# Patient Record
Sex: Male | Born: 1970 | Hispanic: Yes | Marital: Married | Smoking: Current every day smoker
Health system: Southern US, Community
[De-identification: ages and names within clinical notes are randomized; demographics above are authoritative.]

## PROBLEM LIST (undated history)

## (undated) ENCOUNTER — Emergency Department (HOSPITAL_COMMUNITY): Admission: EM | Payer: Self-pay | Source: Home / Self Care

## (undated) DIAGNOSIS — R011 Cardiac murmur, unspecified: Secondary | ICD-10-CM

---

## 2016-05-29 ENCOUNTER — Emergency Department (HOSPITAL_COMMUNITY)
Admission: EM | Admit: 2016-05-29 | Discharge: 2016-05-30 | Disposition: A | Discharge status: Home / Self Care | Payer: Medicaid Other | Attending: Emergency Medicine | Admitting: Emergency Medicine

## 2016-05-29 ENCOUNTER — Encounter (HOSPITAL_COMMUNITY): Payer: Self-pay | Admitting: Emergency Medicine

## 2016-05-29 DIAGNOSIS — Y990 Civilian activity done for income or pay: Secondary | ICD-10-CM | POA: Diagnosis not present

## 2016-05-29 DIAGNOSIS — Y9389 Activity, other specified: Secondary | ICD-10-CM | POA: Insufficient documentation

## 2016-05-29 DIAGNOSIS — X58XXXA Exposure to other specified factors, initial encounter: Secondary | ICD-10-CM | POA: Diagnosis not present

## 2016-05-29 DIAGNOSIS — F17200 Nicotine dependence, unspecified, uncomplicated: Secondary | ICD-10-CM | POA: Diagnosis not present

## 2016-05-29 DIAGNOSIS — Y929 Unspecified place or not applicable: Secondary | ICD-10-CM | POA: Diagnosis not present

## 2016-05-29 DIAGNOSIS — T1591XA Foreign body on external eye, part unspecified, right eye, initial encounter: Secondary | ICD-10-CM | POA: Insufficient documentation

## 2016-05-29 DIAGNOSIS — S0591XA Unspecified injury of right eye and orbit, initial encounter: Secondary | ICD-10-CM | POA: Diagnosis present

## 2016-05-29 MED ORDER — FLUORESCEIN SODIUM 1 MG OP STRP
1.0000 | ORAL_STRIP | Freq: Once | OPHTHALMIC | Status: AC
  Administered 2016-05-30: 1 via OPHTHALMIC
  Filled 2016-05-29: qty 1

## 2016-05-29 MED ORDER — TETRACAINE HCL 0.5 % OP SOLN
2.0000 [drp] | Freq: Once | OPHTHALMIC | Status: AC
  Administered 2016-05-30: 2 [drp] via OPHTHALMIC
  Filled 2016-05-29: qty 2

## 2016-05-29 NOTE — ED Triage Notes (Signed)
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  Pt sts he was wearing safety goggles at the time.  Redness and slight swelling noted.

## 2016-05-30 MED ORDER — ACETAMINOPHEN 500 MG PO TABS
1000.0000 mg | ORAL_TABLET | Freq: Once | ORAL | Status: AC
  Administered 2016-05-30: 1000 mg via ORAL
  Filled 2016-05-30: qty 2

## 2016-05-30 MED ORDER — ERYTHROMYCIN 5 MG/GM OP OINT: TOPICAL_OINTMENT | OPHTHALMIC | 0 refills | Status: AC

## 2016-05-30 NOTE — Discharge Instructions (Signed)
Apply your antibiotic ointment to your right eye 4 times daily for the next 5 days. You may also continue to use saline eyedrops throughout the day for additional relief. Tried to refrain from scratching or rubbing her eyes for the next few days and today your symptoms have improved. I recommend following up with the ophthalmology clinic listed below in the next 1-2 days if your symptoms have not improved or have worsened. Please return to the Emergency Department if symptoms worsen or new onset of fever, facial swelling, eye drainage, loss of vision, worsening pain.

## 2016-05-30 NOTE — ED Provider Notes (Signed)
[facility]-EMERGENCY DEPT Provider Note   CSN: [id number] Arrival date & time: 05/29/16  2318     History   Chief Complaint Chief Complaint  Patient presents with  . Eye Pain    HPI Barry Benson is a 45 y.o. male.  HPI   Patient is a 45 year old male with no pertinent past medical history presents the ED with complaint of right eye pain, onset this afternoon. 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 He reports feels like something is poking the left side of my right eye. Endorses associated redness and intermittent watering. Patient denies rubbing his eye. Denies any recent eye injury or trauma. He reports when he got home from work he tried to flush out his eye at home with water and Visine drops but denies improvement of symptoms. Denies use of contacts. Denies fever, swelling, visual changes, photophobia.  History reviewed. No pertinent past medical history.  There are no active problems to display for this patient.   History reviewed. No pertinent surgical history.     Home Medications    Prior to Admission medications   Medication Sig Start Date End Date Taking? Authorizing Provider  erythromycin ophthalmic ointment Place a 1/2 inch ribbon of ointment into the lower eyelid 4 times daily for the next 5 days. 05/30/16   Barrett HenleNicole Elizabeth Nadeau, PA-C    Family History History reviewed. No pertinent family history.  Social History Social History  Substance Use Topics  . Smoking status: Current Every Day Smoker    Packs/day: 1.00  . Smokeless tobacco: Never Used  . Alcohol use Yes     Comment: occasionally     Allergies   Patient has no allergy information on record.   Review of Systems Review of Systems  Constitutional: Negative for fever.  HENT: Negative for facial swelling.   Eyes: Positive for pain, discharge (watery)  and redness. Negative for photophobia, itching and visual disturbance.  Neurological: Negative for headaches.     Physical Exam Updated Vital Signs BP 147/93 (BP Location: Right Arm)   Pulse 88   Temp 98.1 F (36.7 C) (Oral)   Resp 18   Ht 5\' 10"  (1.778 m)   Wt 77.1 kg   SpO2 98%   BMI 24.39 kg/m   Physical Exam  Constitutional: He is oriented to person, place, and time. He appears well-developed and well-nourished.  HENT:  Head: Normocephalic and atraumatic.  Eyes: EOM and lids are normal. Pupils are equal, round, and reactive to light. Lids are everted and swept, no foreign bodies found. Right eye exhibits discharge (watery). Right eye exhibits no chemosis, no exudate and no hordeolum. No foreign body present in the right eye. Left eye exhibits no discharge. Right conjunctiva is injected. Right conjunctiva has no hemorrhage. No scleral icterus.  Slit lamp exam:      The right eye shows foreign body (small strand of hair noted and removed with q-tip). The right eye shows no corneal abrasion, no corneal flare, no corneal ulcer, no hyphema, no fluorescein uptake and no anterior chamber bulge.  Neck: Normal range of motion. Neck supple.  Cardiovascular: Normal rate.   Pulmonary/Chest: Effort normal.  Musculoskeletal: Normal range of motion.  Neurological: He is alert and oriented to person, place, and time.  Skin: Skin is warm and dry.  Nursing note and vitals reviewed.    ED Treatments /  Results  Labs (all labs ordered are listed, but only abnormal results are displayed) Labs Reviewed - No data to display  EKG  EKG Interpretation None       Radiology No results found.  Procedures Procedures (including critical care time)  Medications Ordered in ED Medications  tetracaine (PONTOCAINE) 0.5 % ophthalmic solution 2 drop (2 drops Right Eye Given by Other 05/30/16 0120)  fluorescein ophthalmic strip 1 strip (1 strip Right Eye Given by Other 05/30/16 0120)    acetaminophen (TYLENOL) tablet 1,000 mg (1,000 mg Oral Given 05/30/16 0120)     Initial Impression / Assessment and Plan / ED Course  I have reviewed the triage vital signs and the nursing notes.  Pertinent labs & imaging results that were available during my care of the patient were reviewed by me and considered in my medical decision making (see chart for details).  Clinical Course     Patient presents with right eye redness, watering and pain that occurred approximately 30 minutes after he was cutting metal at work. Endorses wearing safety goggles. Denies any eye injury. Denies use of contacts. VSS. Exam revealed injection of right conjunctiva with watery discharge. Small strand of hair noted on fluoroscopy seen eye exam, hair removed with Q-tip. Patient reports improvement of symptoms after removal of foreign body. Remaining exam unremarkable. No evidence of metal or any other form bodies noted. Visual acuity unremarkable. No purulent discharge, corneal abrasions, entrapment, consensual photophobia, or dendritic staining with fluorescein study.  Presentation non-concerning for iritis, bacterial conjunctivitis, corneal abrasions, or HSV.  Personal hygiene and frequent handwashing discussed.  Patient advised to followup with ophthalmologist if symptoms persist or worsen in any way including vision change or purulent discharge.  Patient verbalizes understanding and is agreeable with discharge. Discussed return precautions.  Final Clinical Impressions(s) / ED Diagnoses   Final diagnoses:  Foreign body of right eye, initial encounter    New Prescriptions New Prescriptions   ERYTHROMYCIN OPHTHALMIC OINTMENT    Place a 1/2 inch ribbon of ointment into the lower eyelid 4 times daily for the next 5 days.     Satira Sarkicole Elizabeth East HerkimerNadeau, [place] 05/30/16 0130    Dione Boozeavid Glick, MD 05/30/16 [phone]

## 2016-05-30 NOTE — ED Notes (Signed)
Patient is A&Ox4 at this time.  Patient in no signs of distress.  Please see providers note for complete history and physical exam.  

## 2016-05-30 NOTE — ED Notes (Signed)
Patient Alert and oriented X4. Stable and ambulatory. Patient verbalized understanding of the discharge instructions.  Patient belongings were taken by the patient.  

## 2017-06-07 ENCOUNTER — Encounter (HOSPITAL_COMMUNITY): Payer: Self-pay | Admitting: Emergency Medicine

## 2017-06-07 ENCOUNTER — Emergency Department (HOSPITAL_COMMUNITY)
Admission: EM | Admit: 2017-06-07 | Discharge: 2017-06-07 | Disposition: A | Discharge status: Home / Self Care | Payer: Self-pay | Attending: Emergency Medicine | Admitting: Emergency Medicine

## 2017-06-07 ENCOUNTER — Other Ambulatory Visit: Payer: Self-pay

## 2017-06-07 ENCOUNTER — Emergency Department (HOSPITAL_COMMUNITY): Payer: Self-pay

## 2017-06-07 DIAGNOSIS — R079 Chest pain, unspecified: Secondary | ICD-10-CM | POA: Insufficient documentation

## 2017-06-07 DIAGNOSIS — Z5321 Procedure and treatment not carried out due to patient leaving prior to being seen by health care provider: Secondary | ICD-10-CM | POA: Insufficient documentation

## 2017-06-07 HISTORY — DX: Cardiac murmur, unspecified: R01.1

## 2017-06-07 LAB — I-STAT TROPONIN, ED: TROPONIN I, POC: 0.01 ng/mL (ref 0.00–0.08)

## 2017-06-07 LAB — BASIC METABOLIC PANEL
ANION GAP: 8 (ref 5–15)
BUN: 10 mg/dL (ref 6–20)
CALCIUM: 9.1 mg/dL (ref 8.9–10.3)
CO2: 26 mmol/L (ref 22–32)
CREATININE: 0.92 mg/dL (ref 0.61–1.24)
Chloride: 101 mmol/L (ref 101–111)
GFR calc Af Amer: >60 mL/min (ref >60)
GLUCOSE: 107 mg/dL — AB (ref 65–99)
Potassium: 3.4 mmol/L — ABNORMAL LOW (ref 3.5–5.1)
Sodium: 135 mmol/L (ref 135–145)

## 2017-06-07 LAB — CBC
HCT: 42 % (ref 39.0–52.0)
Hemoglobin: 14.4 g/dL (ref 13.0–17.0)
MCH: 33.3 pg (ref 26.0–34.0)
MCHC: 34.3 g/dL (ref 30.0–36.0)
MCV: 97.2 fL (ref 78.0–100.0)
PLATELETS: 172 10*3/uL (ref 150–400)
RBC: 4.32 MIL/uL (ref 4.22–5.81)
RDW: 13.2 % (ref 11.5–15.5)
WBC: 8.3 10*3/uL (ref 4.0–10.5)

## 2017-06-07 NOTE — ED Notes (Signed)
Pt does not answer in waiting area x 3  

## 2017-06-07 NOTE — ED Triage Notes (Signed)
Pt c/o 7/10 left side cp that started while he was at work, denies any dizziness, nausea and vomiting.

## 2017-09-20 ENCOUNTER — Other Ambulatory Visit: Payer: Self-pay

## 2017-09-24 ENCOUNTER — Other Ambulatory Visit (HOSPITAL_COMMUNITY): Payer: Self-pay | Admitting: Family Medicine

## 2017-09-24 ENCOUNTER — Ambulatory Visit (HOSPITAL_COMMUNITY)
Admission: RE | Admit: 2017-09-24 | Discharge: 2017-09-24 | Disposition: A | Discharge status: Home / Self Care | Payer: No Typology Code available for payment source | Source: Ambulatory Visit | Attending: Family Medicine | Admitting: Family Medicine

## 2017-09-24 DIAGNOSIS — M545 Low back pain: Secondary | ICD-10-CM | POA: Diagnosis not present

## 2017-09-24 DIAGNOSIS — R102 Pelvic and perineal pain: Secondary | ICD-10-CM

## 2017-09-24 DIAGNOSIS — M25562 Pain in left knee: Secondary | ICD-10-CM

## 2017-09-24 DIAGNOSIS — M40209 Unspecified kyphosis, site unspecified: Secondary | ICD-10-CM | POA: Diagnosis not present

## 2017-09-24 DIAGNOSIS — M5136 Other intervertebral disc degeneration, lumbar region: Secondary | ICD-10-CM | POA: Insufficient documentation

## 2017-09-24 DIAGNOSIS — M47812 Spondylosis without myelopathy or radiculopathy, cervical region: Secondary | ICD-10-CM | POA: Insufficient documentation

## 2017-09-24 DIAGNOSIS — M546 Pain in thoracic spine: Secondary | ICD-10-CM | POA: Diagnosis not present

## 2018-09-14 ENCOUNTER — Emergency Department (HOSPITAL_COMMUNITY)
Admission: EM | Admit: 2018-09-14 | Discharge: 2018-09-14 | Disposition: A | Discharge status: Home / Self Care | Payer: Medicaid Other | Attending: Emergency Medicine | Admitting: Emergency Medicine

## 2018-09-14 ENCOUNTER — Encounter (HOSPITAL_COMMUNITY): Payer: Self-pay

## 2018-09-14 ENCOUNTER — Other Ambulatory Visit: Payer: Self-pay

## 2018-09-14 DIAGNOSIS — F17210 Nicotine dependence, cigarettes, uncomplicated: Secondary | ICD-10-CM | POA: Insufficient documentation

## 2018-09-14 DIAGNOSIS — J069 Acute upper respiratory infection, unspecified: Secondary | ICD-10-CM | POA: Insufficient documentation

## 2018-09-14 NOTE — Discharge Instructions (Addendum)
Due to report of recent possible febrile illness, may not return to work until 1 week after symptom onset and fever free for 72 hours.

## 2018-09-14 NOTE — ED Triage Notes (Signed)
Pt didn't feel well 09-12-18, felt "hot'.  Employer wanted pt to come to ED and be cleared to go back to work.  No c/o cough/cold, fever, N/V/D,

## 2018-09-14 NOTE — ED Provider Notes (Signed)
[facility] EMERGENCY DEPARTMENT Provider Note   CSN: [id number] Arrival date & time: 09/14/18  1334    History   Chief Complaint Chief Complaint  Patient presents with  . Letter for School/Work    HPI Barry Benson is a 48 y.o. male.     48 year old male presents with quest for note to return to work.  Patient states that he called out of work on Friday because he felt feverish with a cough and his employer is requiring a note to be able to return to work.  Patient states he is feeling better at this time without complaints today.     Past Medical History:  Diagnosis Date  . Heart murmur     There are no active problems to display for this patient.   History reviewed. No pertinent surgical history.      Home Medications    Prior to Admission medications   Medication Sig Start Date End Date Taking? Authorizing Provider  erythromycin ophthalmic ointment Place a 1/2 inch ribbon of ointment into the lower eyelid 4 times daily for the next 5 days. 05/30/16   Barrett Henle, PA-C    Family History History reviewed. No pertinent family history.  Social History Social History   Tobacco Use  . Smoking status: Current Every Day Smoker    Packs/day: 1.00  . Smokeless tobacco: Never Used  Substance Use Topics  . Alcohol use: Yes    Comment: occasionally  . Drug use: No     Allergies   Patient has no known allergies.   Review of Systems Review of Systems  Constitutional: Positive for fever.  HENT: Negative for congestion and sore throat.   Respiratory: Positive for cough.   Gastrointestinal: Negative for abdominal pain, nausea and vomiting.  Musculoskeletal: Negative for arthralgias and myalgias.  Skin: Negative for rash.  Allergic/Immunologic: Negative for immunocompromised state.  Neurological: Negative for headaches.  Hematological: Negative for adenopathy.  All other systems reviewed and are negative.    Physical Exam  Updated Vital Signs BP 138/83 (BP Location: Right Arm)   Pulse (!) 103   Temp 98 F (36.7 C) (Oral)   Resp 17   Ht 5\' 11"  (1.803 m)   Wt 89.8 kg   SpO2 100%   BMI 27.62 kg/m   Physical Exam Vitals signs and nursing note reviewed.  Constitutional:      General: He is not in acute distress.    Appearance: He is well-developed. He is not diaphoretic.  HENT:     Head: Normocephalic and atraumatic.     Right Ear: Tympanic membrane and ear canal normal.     Left Ear: Tympanic membrane and ear canal normal.     Mouth/Throat:     Mouth: Mucous membranes are moist.     Pharynx: No oropharyngeal exudate or posterior oropharyngeal erythema.  Eyes:     Conjunctiva/sclera: Conjunctivae normal.  Neck:     Musculoskeletal: Neck supple.  Cardiovascular:     Rate and Rhythm: Normal rate and regular rhythm.     Heart sounds: Normal heart sounds.  Pulmonary:     Effort: Pulmonary effort is normal.     Breath sounds: Normal breath sounds.  Lymphadenopathy:     Cervical: No cervical adenopathy.  Skin:    General: Skin is warm and dry.  Neurological:     Mental Status: He is alert and oriented to person, place, and time.  Psychiatric:  Behavior: Behavior normal.      ED Treatments / Results  Labs (all labs ordered are listed, but only abnormal results are displayed) Labs Reviewed - No data to display  EKG None  Radiology No results found.  Procedures Procedures (including critical care time)  Medications Ordered in ED Medications - No data to display   Initial Impression / Assessment and Plan / ED Course  I have reviewed the triage vital signs and the nursing notes.  Pertinent labs & imaging results that were available during my care of the patient were reviewed by me and considered in my medical decision making (see chart for details).  Clinical Course as of Sep 14 1351  Sun Sep 14, 2018  1352 48yo male with report of feeling feverish with cough/uri symptoms 2  day ago, employer is requiring note to return to work. Possible febrile/respiratory illness, may return to work 1 week after symptom onset as long as he remains fever free for 72 hours.    [LM]    Clinical Course User Index [LM] Jeannie Fend, PA-C   Final Clinical Impressions(s) / ED Diagnoses   Final diagnoses:  Viral upper respiratory illness    ED Discharge Orders    None       Jeannie Fend, PA-C 09/14/18 1354    Margarita Grizzle, MD 09/14/18 1432

## 2018-11-07 IMAGING — DX DG KNEE COMPLETE 4+V*L*
2 series · 2 of 2 positions shown · non-contrast
Comparison: None.

CLINICAL DATA: Left knee pain, no history of trauma

EXAM:
LEFT KNEE - COMPLETE 4+ VIEW

[knee ap]
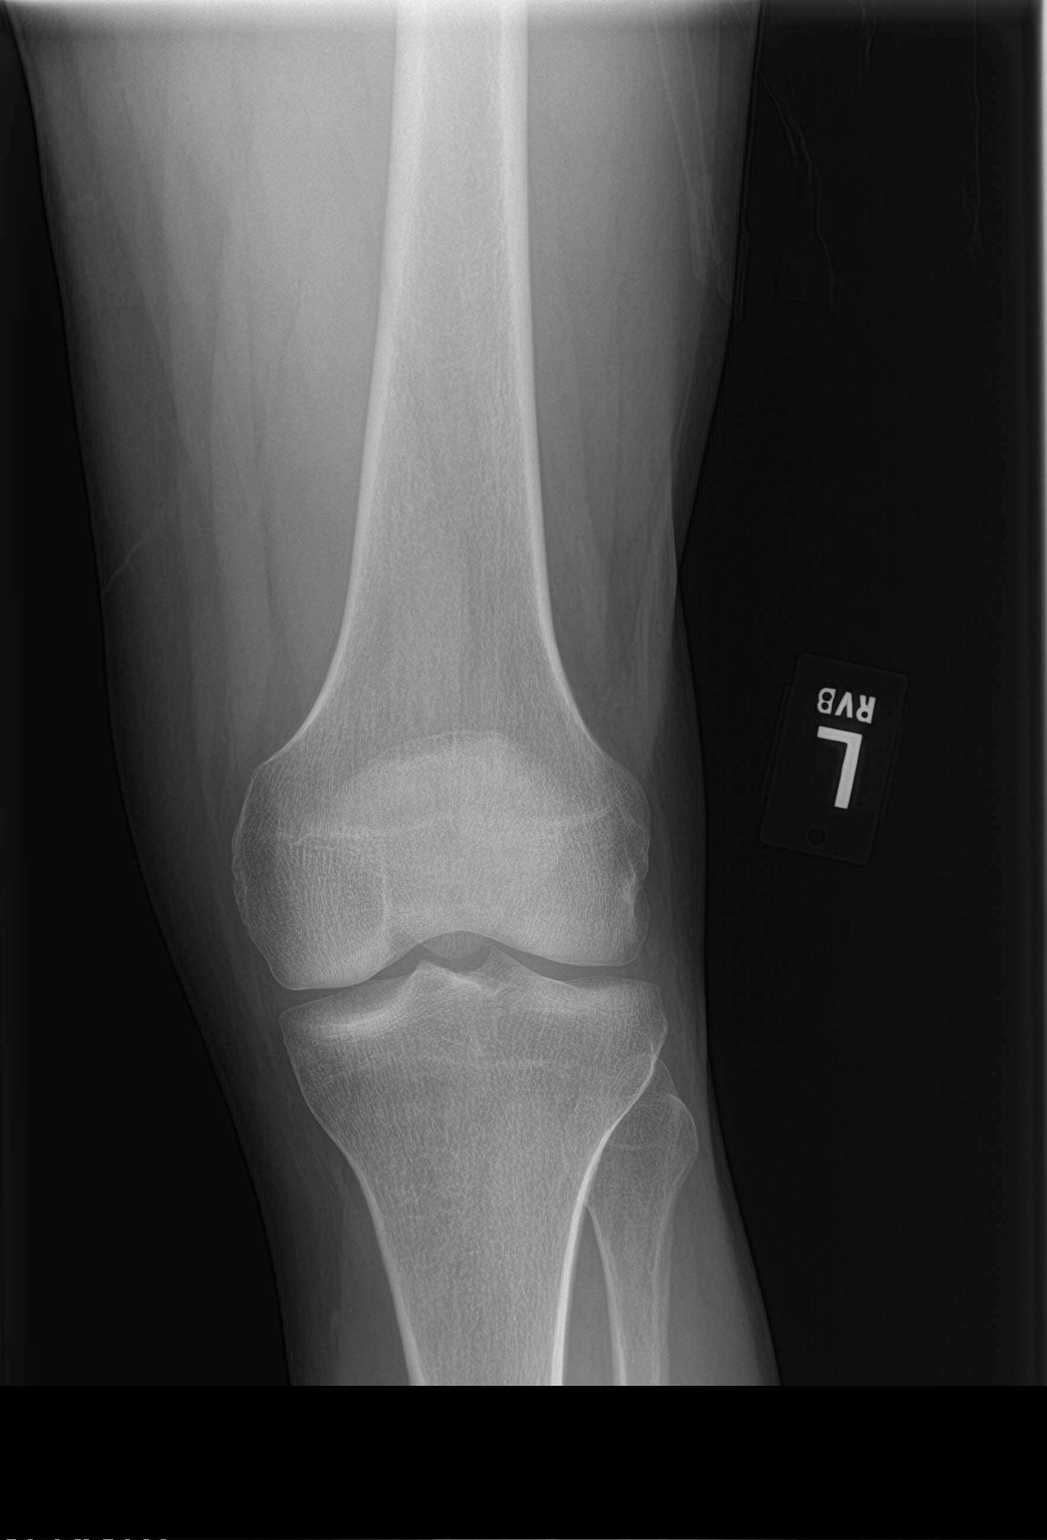

[knee lat]
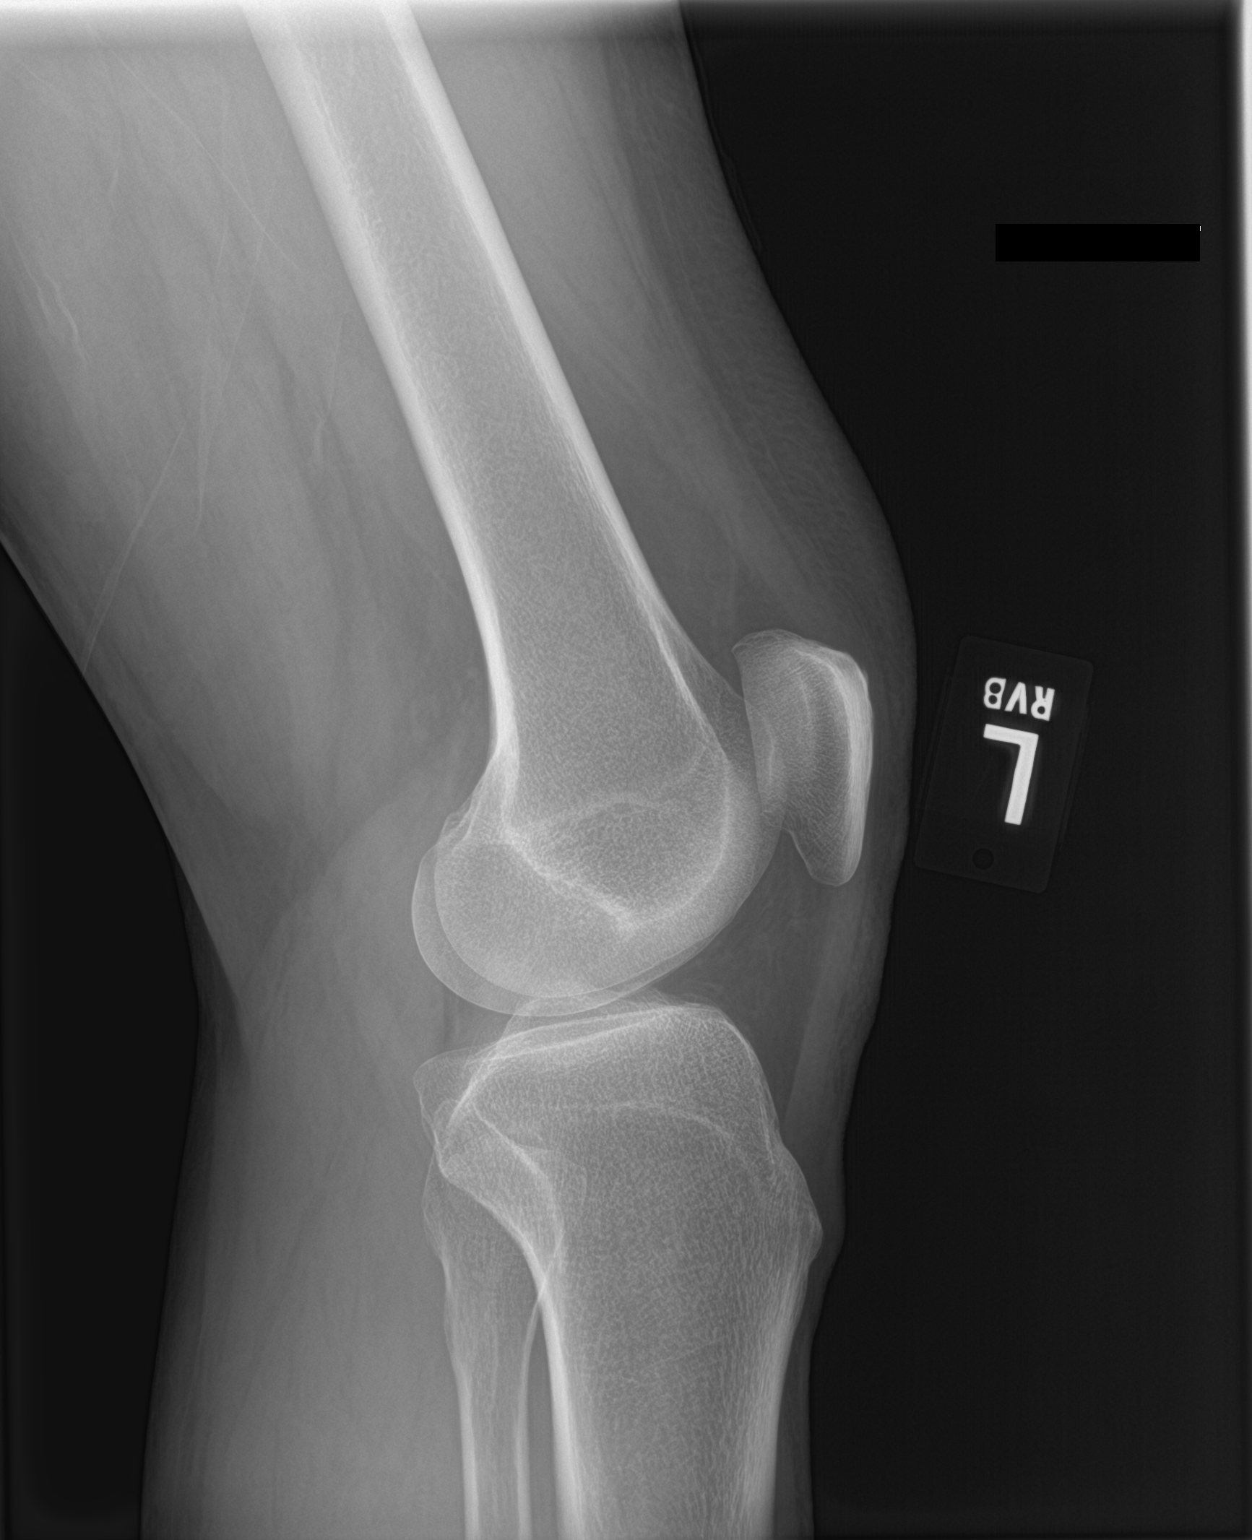

[2 of 2 positions shown; findings below may reference images not displayed]

FINDINGS: Left knee joint spaces appear well preserved. No fracture is seen.
No joint effusion is noted. No degenerative spurring is seen.
IMPRESSION: Negative.

## 2022-11-12 ENCOUNTER — Other Ambulatory Visit: Payer: Self-pay

## 2022-11-12 ENCOUNTER — Encounter (HOSPITAL_COMMUNITY): Payer: Self-pay | Admitting: Emergency Medicine

## 2022-11-12 ENCOUNTER — Emergency Department (HOSPITAL_COMMUNITY)
Admission: EM | Admit: 2022-11-12 | Discharge: 2022-11-13 | Discharge status: Against Medical Advice | Payer: Self-pay | Attending: Student | Admitting: Student

## 2022-11-12 ENCOUNTER — Emergency Department (HOSPITAL_COMMUNITY): Payer: Medicaid Other

## 2022-11-12 DIAGNOSIS — R0789 Other chest pain: Secondary | ICD-10-CM | POA: Insufficient documentation

## 2022-11-12 DIAGNOSIS — Z5321 Procedure and treatment not carried out due to patient leaving prior to being seen by health care provider: Secondary | ICD-10-CM | POA: Insufficient documentation

## 2022-11-12 LAB — BASIC METABOLIC PANEL
Anion gap: 14 (ref 5–15)
BUN: 9 mg/dL (ref 6–20)
CO2: 25 mmol/L (ref 22–32)
Calcium: 9.2 mg/dL (ref 8.9–10.3)
Chloride: 101 mmol/L (ref 98–111)
Creatinine, Ser: 0.89 mg/dL (ref 0.61–1.24)
GFR, Estimated: >60 mL/min (ref >60)
Glucose, Bld: 97 mg/dL (ref 70–99)
Potassium: 3 mmol/L — ABNORMAL LOW (ref 3.5–5.1)
Sodium: 140 mmol/L (ref 135–145)

## 2022-11-12 LAB — CBC
HCT: 41 % (ref 39.0–52.0)
Hemoglobin: 14 g/dL (ref 13.0–17.0)
MCH: 33.7 pg (ref 26.0–34.0)
MCHC: 34.1 g/dL (ref 30.0–36.0)
MCV: 98.6 fL (ref 80.0–100.0)
Platelets: 168 10*3/uL (ref 150–400)
RBC: 4.16 MIL/uL — ABNORMAL LOW (ref 4.22–5.81)
RDW: 13.4 % (ref 11.5–15.5)
WBC: 8.8 10*3/uL (ref 4.0–10.5)
nRBC: 0 % (ref 0.0–0.2)

## 2022-11-12 LAB — TROPONIN I (HIGH SENSITIVITY): Troponin I (High Sensitivity): 7 ng/L (ref <18)

## 2022-11-12 NOTE — ED Triage Notes (Signed)
Pt in with sharp L chest pain that began yesterday in his L shoulder. Pain worse today with movement and coughing. Pain tender on palpation

## 2022-11-12 NOTE — ED Notes (Signed)
Pt told sort he was leaving and left 

## 2022-11-12 NOTE — ED Provider Triage Note (Signed)
Emergency Medicine Provider Triage Evaluation Note  Barry Benson , a 52 y.o. male  was evaluated in triage.  Patient complains of left-sided chest pain.  Started last night as left shoulder pain but when he woke up today he had left-sided chest pain as well.  Reproducible.  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  Pain is worse with inspiration and coughing  Review of Systems  Positive:  Negative:   Physical Exam  BP (!) 140/108   Pulse 97   Temp 98.6 F (37 C)   Resp 18   Wt 85.7 kg   SpO2 100%   BMI 26.36 kg/m  Gen:   Awake, no distress   Resp:  Normal effort  MSK:   Moves extremities without difficulty  Other:  Reproducible chest wall tenderness.  Pain worse with flexion of the left shoulder. rrr  Medical Decision Making  Medically screening exam initiated at 7:37 PM.  Appropriate orders placed.  Barry Benson was informed that the remainder of the evaluation will be completed by another provider, this initial triage assessment does not replace that evaluation, and the importance of remaining in the ED until their evaluation is complete.     Barry Benson, [place] 11/12/22 [phone]
